# Patient Record
Sex: Female | Born: 1960 | Race: Black or African American | Hispanic: No | Marital: Single | Smoking: Never smoker
Health system: Southern US, Community
[De-identification: ages and names within clinical notes are randomized; demographics above are authoritative.]

---

## 2004-01-14 ENCOUNTER — Emergency Department (HOSPITAL_COMMUNITY): Admission: EM | Admit: 2004-01-14 | Discharge: 2004-01-14 | Payer: Self-pay | Admitting: Family Medicine

## 2020-03-19 ENCOUNTER — Ambulatory Visit
Admission: EM | Admit: 2020-03-19 | Discharge: 2020-03-19 | Disposition: A | Discharge status: Home / Self Care | Payer: 59 | Attending: Emergency Medicine | Admitting: Emergency Medicine

## 2020-03-19 DIAGNOSIS — R112 Nausea with vomiting, unspecified: Secondary | ICD-10-CM

## 2020-03-19 DIAGNOSIS — R509 Fever, unspecified: Secondary | ICD-10-CM | POA: Diagnosis not present

## 2020-03-19 LAB — POC SARS CORONAVIRUS 2 AG -  ED: SARS Coronavirus 2 Ag: NEGATIVE

## 2020-03-19 MED ORDER — ACETAMINOPHEN 325 MG PO TABS
975.0000 mg | ORAL_TABLET | Freq: Once | ORAL | Status: AC
  Administered 2020-03-19: 975 mg via ORAL

## 2020-03-19 MED ORDER — ONDANSETRON 4 MG PO TBDP: 4.0000 mg | ORAL_TABLET | Freq: Three times a day (TID) | ORAL | 0 refills | Status: AC | PRN

## 2020-03-19 MED ORDER — ONDANSETRON 4 MG PO TBDP
4.0000 mg | ORAL_TABLET | Freq: Once | ORAL | Status: AC
  Administered 2020-03-19: 4 mg via ORAL

## 2020-03-19 NOTE — ED Triage Notes (Signed)
Pt presents to UC for n/v since Sunday. Pt states she traveled home from [place] on Sunday and has been unable to take PO since. Pt denies chills, or body aches. Pt denies loss of taste or smell. PT denies runny nose, HA, or congestion. Pt denies OTC treatment, or relieving factors. Pt states she is feeling dizzy. Pt ambulated to treatment area unassisted.

## 2020-03-19 NOTE — ED Provider Notes (Signed)
[facility]    CSN: [id number] Arrival date & time: 03/19/20  1326      History   Chief Complaint Chief Complaint  Patient presents with  . Nausea    HPI Janice Avila is a 59 y.o. female presenting for nausea and vomiting since Sunday.  Patient states she traveled home from [place] on Sunday and has had poor oral intake since.  States that if she drinks water she throws it up.  Denies chest pain, palpitations, difficulty breathing, lightheadedness or change in vision/hearing.  Patient does feel a little dizzy at times, particularly when standing up.  No fall, lower leg swelling or history of DVT.  Denies history of pancreatitis, abdominal pain, change in urination or bowel habit.  No bloody or biliary emesis.  No projectile vomiting.   History reviewed. No pertinent past medical history.  There are no problems to display for this patient.   History reviewed. No pertinent surgical history.  OB History   No obstetric history on file.      Home Medications    Prior to Admission medications   Medication Sig Start Date End Date Taking? Authorizing Provider  ondansetron (ZOFRAN ODT) 4 MG disintegrating tablet Take 1 tablet (4 mg total) by mouth every 8 (eight) hours as needed for nausea or vomiting. 03/19/20   Hall-Potvin, [place], PA-C    Family History History reviewed. No pertinent family history.  Social History Social History   Tobacco Use  . Smoking status: Never Smoker  . Smokeless tobacco: Never Used  Substance Use Topics  . Alcohol use: Not on file  . Drug use: Not on file     Allergies   Patient has no allergy information on record.   Review of Systems As per HPI   Physical Exam Triage Vital Signs ED Triage Vitals  Enc Vitals Group     BP      Pulse      Resp      Temp      Temp src      SpO2      Weight      Height      Head Circumference      Peak Flow      Pain Score      Pain Loc      Pain Edu?      Excl. in [place]?     No data found.  Updated Vital Signs BP (!) 154/97 (BP Location: Left Arm)   Pulse 87   Temp (!) 102.7 F (39.3 C) (Oral)   Resp 18   SpO2 97%   Visual Acuity Right Eye Distance:   Left Eye Distance:   Bilateral Distance:    Right Eye Near:   Left Eye Near:    Bilateral Near:     Physical Exam Constitutional:      General: She is not in acute distress. HENT:     Head: Normocephalic and atraumatic.     Mouth/Throat:     Mouth: Mucous membranes are moist.     Pharynx: Oropharynx is clear.  Eyes:     General: No scleral icterus.    Conjunctiva/sclera: Conjunctivae normal.     Pupils: Pupils are equal, round, and reactive to light.  Cardiovascular:     Rate and Rhythm: Normal rate and regular rhythm.  Pulmonary:     Effort: Pulmonary effort is normal. No respiratory distress.     Breath sounds: No wheezing.  Abdominal:  General: Bowel sounds are normal.     Palpations: Abdomen is soft.     Tenderness: There is no abdominal tenderness.  Musculoskeletal:     Cervical back: Neck supple. No tenderness.     Right lower leg: No edema.     Left lower leg: No edema.  Lymphadenopathy:     Cervical: No cervical adenopathy.  Skin:    Capillary Refill: Capillary refill takes less than 2 seconds.     Coloration: Skin is not jaundiced or pale.  Neurological:     General: No focal deficit present.     Mental Status: She is alert and oriented to person, place, and time.      UC Treatments / Results  Labs (all labs ordered are listed, but only abnormal results are displayed) Labs Reviewed  POC SARS CORONAVIRUS 2 AG -  ED - Normal  NOVEL CORONAVIRUS, NAA    EKG   Radiology No results found.  Procedures Procedures (including critical care time)  Medications Ordered in UC Medications  acetaminophen (TYLENOL) tablet 975 mg (975 mg Oral Given 03/19/20 1354)  ondansetron (ZOFRAN-ODT) disintegrating tablet 4 mg (4 mg Oral Given 03/19/20 1354)    Initial  Impression / Assessment and Plan / UC Course  I have reviewed the triage vital signs and the nursing notes.  Pertinent labs & imaging results that were available during my care of the patient were reviewed by me and considered in my medical decision making (see chart for details).     Patient afebrile, nontoxic, with SpO2 97%.  Rapid Covid negative, PCR pending.  Patient to quarantine until results are back.  Patient given Tylenol, Zofran for symptoms: Tolerated well and endorsing improvement of symptoms at time of discharge.  Successfully completed 100 cc p.o. challenge without emesis.  We will treat supportively as outlined below.  Focus on oral fluid resuscitation.  Return precautions discussed, patient verbalized understanding and is agreeable to plan. Final Clinical Impressions(s) / UC Diagnoses   Final diagnoses:  Fever, unspecified  Non-intractable vomiting with nausea, unspecified vomiting type     Discharge Instructions     Take Zofran every 8 hours as needed for nausea. Important to push fluids: Water, sugar-free Gatorade, sugar-free popsicles. Go to ER for worsening vomiting, fever, or you develop chest pain, difficulty breathing, abdominal pain.    ED Prescriptions    Medication Sig Dispense Auth. Provider   ondansetron (ZOFRAN ODT) 4 MG disintegrating tablet Take 1 tablet (4 mg total) by mouth every 8 (eight) hours as needed for nausea or vomiting. 21 tablet Hall-Potvin, [place], PA-C     PDMP not reviewed this encounter.   Hall-Potvin, [place], [place] 03/19/20 1525

## 2020-03-19 NOTE — Discharge Instructions (Signed)
Take Zofran every 8 hours as needed for nausea. Important to push fluids: Water, sugar-free Gatorade, sugar-free popsicles. Go to ER for worsening vomiting, fever, or you develop chest pain, difficulty breathing, abdominal pain.

## 2020-03-20 ENCOUNTER — Emergency Department (HOSPITAL_COMMUNITY): Payer: 59

## 2020-03-20 ENCOUNTER — Encounter (HOSPITAL_COMMUNITY): Payer: Self-pay | Admitting: *Deleted

## 2020-03-20 ENCOUNTER — Emergency Department (HOSPITAL_COMMUNITY)
Admission: EM | Admit: 2020-03-20 | Discharge: 2020-03-20 | Disposition: A | Discharge status: Home / Self Care | Payer: 59 | Attending: Emergency Medicine | Admitting: Emergency Medicine

## 2020-03-20 ENCOUNTER — Other Ambulatory Visit: Payer: Self-pay

## 2020-03-20 DIAGNOSIS — J189 Pneumonia, unspecified organism: Secondary | ICD-10-CM | POA: Insufficient documentation

## 2020-03-20 DIAGNOSIS — M549 Dorsalgia, unspecified: Secondary | ICD-10-CM | POA: Diagnosis not present

## 2020-03-20 DIAGNOSIS — R509 Fever, unspecified: Secondary | ICD-10-CM | POA: Diagnosis present

## 2020-03-20 LAB — CBC WITH DIFFERENTIAL/PLATELET
Abs Immature Granulocytes: 0.04 10*3/uL (ref 0.00–0.07)
Basophils Absolute: 0 10*3/uL (ref 0.0–0.1)
Basophils Relative: 0 %
Eosinophils Absolute: 0 10*3/uL (ref 0.0–0.5)
Eosinophils Relative: 0 %
HCT: 41.3 % (ref 36.0–46.0)
Hemoglobin: 13.2 g/dL (ref 12.0–15.0)
Immature Granulocytes: 1 %
Lymphocytes Relative: 9 %
Lymphs Abs: 0.8 10*3/uL (ref 0.7–4.0)
MCH: 28.9 pg (ref 26.0–34.0)
MCHC: 32 g/dL (ref 30.0–36.0)
MCV: 90.4 fL (ref 80.0–100.0)
Monocytes Absolute: 0.4 10*3/uL (ref 0.1–1.0)
Monocytes Relative: 5 %
Neutro Abs: 7.4 10*3/uL (ref 1.7–7.7)
Neutrophils Relative %: 85 %
Platelets: 319 10*3/uL (ref 150–400)
RBC: 4.57 MIL/uL (ref 3.87–5.11)
RDW: 13.3 % (ref 11.5–15.5)
WBC: 8.6 10*3/uL (ref 4.0–10.5)
nRBC: 0 % (ref 0.0–0.2)

## 2020-03-20 LAB — COMPREHENSIVE METABOLIC PANEL
ALT: 56 U/L — ABNORMAL HIGH (ref 0–44)
AST: 57 U/L — ABNORMAL HIGH (ref 15–41)
Albumin: 3.2 g/dL — ABNORMAL LOW (ref 3.5–5.0)
Alkaline Phosphatase: 71 U/L (ref 38–126)
Anion gap: 13 (ref 5–15)
BUN: 19 mg/dL (ref 6–20)
CO2: 26 mmol/L (ref 22–32)
Calcium: 8.5 mg/dL — ABNORMAL LOW (ref 8.9–10.3)
Chloride: 97 mmol/L — ABNORMAL LOW (ref 98–111)
Creatinine, Ser: 1.69 mg/dL — ABNORMAL HIGH (ref 0.44–1.00)
GFR calc Af Amer: 38 mL/min — ABNORMAL LOW (ref >60)
GFR calc non Af Amer: 33 mL/min — ABNORMAL LOW (ref >60)
Glucose, Bld: 151 mg/dL — ABNORMAL HIGH (ref 70–99)
Potassium: 3.2 mmol/L — ABNORMAL LOW (ref 3.5–5.1)
Sodium: 136 mmol/L (ref 135–145)
Total Bilirubin: 0.8 mg/dL (ref 0.3–1.2)
Total Protein: 7.4 g/dL (ref 6.5–8.1)

## 2020-03-20 LAB — URINALYSIS, ROUTINE W REFLEX MICROSCOPIC
Bilirubin Urine: NEGATIVE
Glucose, UA: NEGATIVE mg/dL
Ketones, ur: NEGATIVE mg/dL
Leukocytes,Ua: NEGATIVE
Nitrite: NEGATIVE
Protein, ur: >=300 mg/dL — AB
RBC / HPF: >50 RBC/hpf — ABNORMAL HIGH (ref 0–5)
Specific Gravity, Urine: 1.028 (ref 1.005–1.030)
pH: 5 (ref 5.0–8.0)

## 2020-03-20 LAB — NOVEL CORONAVIRUS, NAA: SARS-CoV-2, NAA: NOT DETECTED

## 2020-03-20 LAB — SARS-COV-2, NAA 2 DAY TAT

## 2020-03-20 MED ORDER — AZITHROMYCIN 250 MG PO TABS
250.0000 mg | ORAL_TABLET | Freq: Every day | ORAL | 0 refills | Status: AC
Start: 2020-03-21 — End: ?

## 2020-03-20 MED ORDER — SODIUM CHLORIDE 0.9 % IV BOLUS
1000.0000 mL | Freq: Once | INTRAVENOUS | Status: AC
  Administered 2020-03-20: 1000 mL via INTRAVENOUS

## 2020-03-20 MED ORDER — ACETAMINOPHEN 325 MG PO TABS
650.0000 mg | ORAL_TABLET | Freq: Once | ORAL | Status: AC
  Administered 2020-03-20: 650 mg via ORAL
  Filled 2020-03-20: qty 2

## 2020-03-20 MED ORDER — AZITHROMYCIN 250 MG PO TABS
500.0000 mg | ORAL_TABLET | Freq: Once | ORAL | Status: AC
  Administered 2020-03-20: 500 mg via ORAL
  Filled 2020-03-20: qty 2

## 2020-03-20 MED ORDER — CEFDINIR 300 MG PO CAPS
600.0000 mg | ORAL_CAPSULE | Freq: Every day | ORAL | Status: DC
  Administered 2020-03-20: 600 mg via ORAL
  Filled 2020-03-20: qty 2

## 2020-03-20 MED ORDER — CEFUROXIME AXETIL 500 MG PO TABS: 500.0000 mg | ORAL_TABLET | Freq: Two times a day (BID) | ORAL | 0 refills | Status: AC

## 2020-03-20 MED ORDER — SODIUM CHLORIDE 0.9% FLUSH: 3.0000 mL | Freq: Once | INTRAVENOUS | Status: DC

## 2020-03-20 NOTE — ED Triage Notes (Addendum)
Pt reports feeling bad since flying back from [place] on Sunday. Went to ucc yesterday and given zofran for her nausea but is still having a fever. Denies any other symptoms except occasional right side back pain.

## 2020-03-20 NOTE — Discharge Instructions (Signed)
You can take Tylenol every 6 hours as needed for fever.  Take the antibiotics starting tomorrow, as directed, until gone. It is important you establish primary care.  In the meantime, please follow-up at urgent care for recheck in 2 to 3 days to ensure your feeling better. If symptoms worsen, if you develop shortness of breath, or any new or concerning symptoms, please return to the emergency department.

## 2020-03-20 NOTE — ED Provider Notes (Signed)
[facility] EMERGENCY DEPARTMENT Provider Note   CSN: [id number] Arrival date & time: 03/20/20  1158     History Chief Complaint  Patient presents with  . Fever  . Nausea  . Back Pain    Janice Avila is a 59 y.o. female presenting to the ED with 5 days of fevers.  She states symptoms began on Sunday while she was traveling home from [place].  She states symptoms initially began with dry heaves and vomiting.  Her vomiting has improved, though she has had decreased appetite.  Today is a first day she feels as though she is hungry.  She has had a cough productive of a small amount of phlegm.  Her back is aching her, worse on the right side though no urinary symptoms.  She states she did not have any of the recommended vaccines such as typhoid or yellow fever prior to traveling to [place].  She was seen at urgent care yesterday who tested her for Covid which were both negative.  She denies abdominal pain, diarrhea, shortness of breath, sore throat, urinary symptoms.  She only drank bottled water while in [place].  She denies any mosquito bites. She reports she has not had any regular medical care in at least 20 years, she states the last laboratory work was probably about 10 years ago.  She has no known history of hypertension though is unsure.  She is unsure of her baseline kidney function.  The history is provided by the patient and medical records.       History reviewed. No pertinent past medical history.  There are no problems to display for this patient.   History reviewed. No pertinent surgical history.   OB History   No obstetric history on file.     History reviewed. No pertinent family history.  Social History   Tobacco Use  . Smoking status: Never Smoker  . Smokeless tobacco: Never Used  Substance Use Topics  . Alcohol use: Not on file  . Drug use: Not on file    Home Medications Prior to Admission medications   Medication Sig Start Date End  Date Taking? Authorizing Provider  ascorbic Acid (VITAMIN C) 500 MG CPCR Take 500 mg by mouth daily.   Yes [provider]  Cholecalciferol (VITAMIN D3) 125 MCG (5000 UT) CAPS Take 5,000 Units by mouth daily.   Yes [provider]  Cyanocobalamin (VITAMIN B-12) 5000 MCG TBDP Take 5,000 mcg by mouth daily.   Yes [provider]  Multiple Vitamin (MULTIVITAMIN WITH MINERALS) TABS tablet Take 1 tablet by mouth daily.   Yes [provider]  Multiple Vitamins-Minerals (ZINC PO) Take 1 capsule by mouth daily.   Yes [provider]  ondansetron (ZOFRAN ODT) 4 MG disintegrating tablet Take 1 tablet (4 mg total) by mouth every 8 (eight) hours as needed for nausea or vomiting. 03/19/20  Yes Hall-Potvin, [place], PA-C  TURMERIC PO Take 1 capsule by mouth daily.   Yes [provider]  azithromycin (ZITHROMAX) 250 MG tablet Take 1 tablet (250 mg total) by mouth daily. 03/21/20   Mohini Heathcock, [place] N, PA-C  cefUROXime (CEFTIN) 500 MG tablet Take 1 tablet (500 mg total) by mouth 2 (two) times daily with a meal for 6 days. 03/21/20 03/27/20  Kessa Fairbairn, [place] N, PA-C    Allergies    Patient has no known allergies.  Review of Systems   Review of Systems  Constitutional: Positive for appetite change, chills and fever.  Respiratory: Positive for cough. Negative for shortness of breath.   Gastrointestinal: Positive for nausea and vomiting. Negative for abdominal pain and diarrhea.  Genitourinary: Negative for dysuria and frequency.  Musculoskeletal: Positive for back pain.  Skin: Negative for rash.  All other systems reviewed and are negative.   Physical Exam Updated Vital Signs BP 137/82   Pulse 63   Temp (!) 100.9 F (38.3 C) (Oral)   Resp (!) 22   SpO2 97%   Physical Exam Vitals and nursing note reviewed.  Constitutional:      General: She is not in acute distress.    Appearance: She is well-developed.  HENT:     Head: Normocephalic and  atraumatic.     Mouth/Throat:     Mouth: Mucous membranes are moist.     Pharynx: Oropharynx is clear.  Eyes:     Conjunctiva/sclera: Conjunctivae normal.  Cardiovascular:     Rate and Rhythm: Normal rate and regular rhythm.  Pulmonary:     Effort: Pulmonary effort is normal. No respiratory distress.     Comments: Faint rales in the bases Abdominal:     General: Bowel sounds are normal.     Palpations: Abdomen is soft.     Tenderness: There is no abdominal tenderness. There is no guarding or rebound.  Musculoskeletal:     Cervical back: Normal range of motion and neck supple. No tenderness.  Lymphadenopathy:     Cervical: No cervical adenopathy.  Skin:    General: Skin is warm.     Findings: No rash.  Neurological:     Mental Status: She is alert.  Psychiatric:        Behavior: Behavior normal.     ED Results / Procedures / Treatments   Labs (all labs ordered are listed, but only abnormal results are displayed) Labs Reviewed  URINALYSIS, ROUTINE W REFLEX MICROSCOPIC - Abnormal; Notable for the following components:      Result Value   Color, Urine AMBER (*)    APPearance CLOUDY (*)    Hgb urine dipstick LARGE (*)    Protein, ur >=300 (*)    RBC / HPF >50 (*)    Bacteria, UA RARE (*)    All other components within normal limits  COMPREHENSIVE METABOLIC PANEL - Abnormal; Notable for the following components:   Potassium 3.2 (*)    Chloride 97 (*)    Glucose, Bld 151 (*)    Creatinine, Ser 1.69 (*)    Calcium 8.5 (*)    Albumin 3.2 (*)    AST 57 (*)    ALT 56 (*)    GFR calc non Af Amer 33 (*)    GFR calc Af Amer 38 (*)    All other components within normal limits  URINE CULTURE  CBC WITH DIFFERENTIAL/PLATELET    EKG None  Radiology DG Chest Port 1 View  Result Date: 03/20/2020 CLINICAL DATA:  Cough and fever. EXAM: PORTABLE CHEST 1 VIEW COMPARISON:  None. FINDINGS: The heart size and pulmonary vascularity are normal. There is bibasilar atelectasis, left  greater than right with small air bronchograms at the left lung base consistent with focal lung consolidation. No discrete effusions.  No bone abnormality. IMPRESSION: Bibasilar atelectasis, left greater than right with focal lung consolidation at the left lung base. Electronically Signed   By: Lorriane Shire M.D.   On: 03/20/2020 17:30    Procedures Procedures (including critical care time)  Medications Ordered in ED Medications  sodium chloride flush (  NS) 0.9 % injection 3 mL (has no administration in time range)  cefdinir (OMNICEF) capsule 600 mg (600 mg Oral Given 03/20/20 1907)  sodium chloride 0.9 % bolus 1,000 mL (0 mLs Intravenous Stopped 03/20/20 2007)  azithromycin (ZITHROMAX) tablet 500 mg (500 mg Oral Given 03/20/20 1840)  acetaminophen (TYLENOL) tablet 650 mg (650 mg Oral Given 03/20/20 1926)    ED Course  I have reviewed the triage vital signs and the nursing notes.  Pertinent labs & imaging results that were available during my care of the patient were reviewed by me and considered in my medical decision making (see chart for details).  Clinical Course as of Mar 20 2045  Thu Mar 20, 2020  1816 Consulted with Dr. Drue Second with ID for abx coverage given pt most likely contracted illness in [place]. Azithro and ceftin   [JR]    Clinical Course User Index [JR] Hendrick Pavich, [place] N, PA-C   MDM Rules/Calculators/A&P                          Patient presenting with fever, cough, nausea/vomiting since "Sunday.  Symptoms began on her return trip home from [place].  No known mosquito bites.  Negative Covid tests yesterday at urgent care including the antigen test and the PCR.  Vomiting has stopped, no diarrhea or abdominal pain.  No urinary symptoms.  On exam, she is in no distress.  She had low-grade fever on arrival to 100.5 F, hypertensive, normal heart rate respiratory rate.  CBC has normal white count.  Metabolic panel with creatinine of 1.7 though unclear of patient's baseline given  lack of medical care for at least 10+ years.  Mild hypokalemia, replaced orally.  Slightly elevated LFTs.  Abdomen is benign.  UA appears to be contaminated with 11-20 squamous, rare bacteria, negative leuks negative nitrates.  Culture sent.  Chest x-ray with findings consistent with pneumonia given patient's clinical presentation this is likely diagnosis.  Given patient's recent travel, consulted with infectious disease, Dr. Snider for antibiotic coverage.  She recommends azithro and ceftin.   Mistakenly thought patient received tylenol in triage, pt given tylenol for fever. Once fluids finish, pt will be appropriate for discharge.  Temp downtrending at discharge.  Instructed close outpatient follow-up and strict return precautions.  Patient safe for discharge. Final Clinical Impression(s) / ED Diagnoses Final diagnoses:  Community acquired pneumonia, unspecified laterality    Rx / DC Orders ED Discharge Orders         Ordered    azithromycin (ZITHROMAX) 250 MG tablet  Daily     Discontinue  Reprint     03/20/20 1920    cefUROXime (CEFTIN) 500 MG tablet  2 times daily with meals     Discontinue  Reprint     06" /24/21 1920           Juwon Scripter, 05-21-1997 N, PA-C 03/20/20 2046    2047, MD 03/25/20 1036

## 2020-03-21 LAB — URINE CULTURE: Culture: NO GROWTH

## 2021-08-29 IMAGING — DX DG CHEST 1V PORT
1 series · 1 of 1 positions shown · non-contrast
Comparison: None.

CLINICAL DATA: Cough and fever.

EXAM:
PORTABLE CHEST 1 VIEW

[chest]
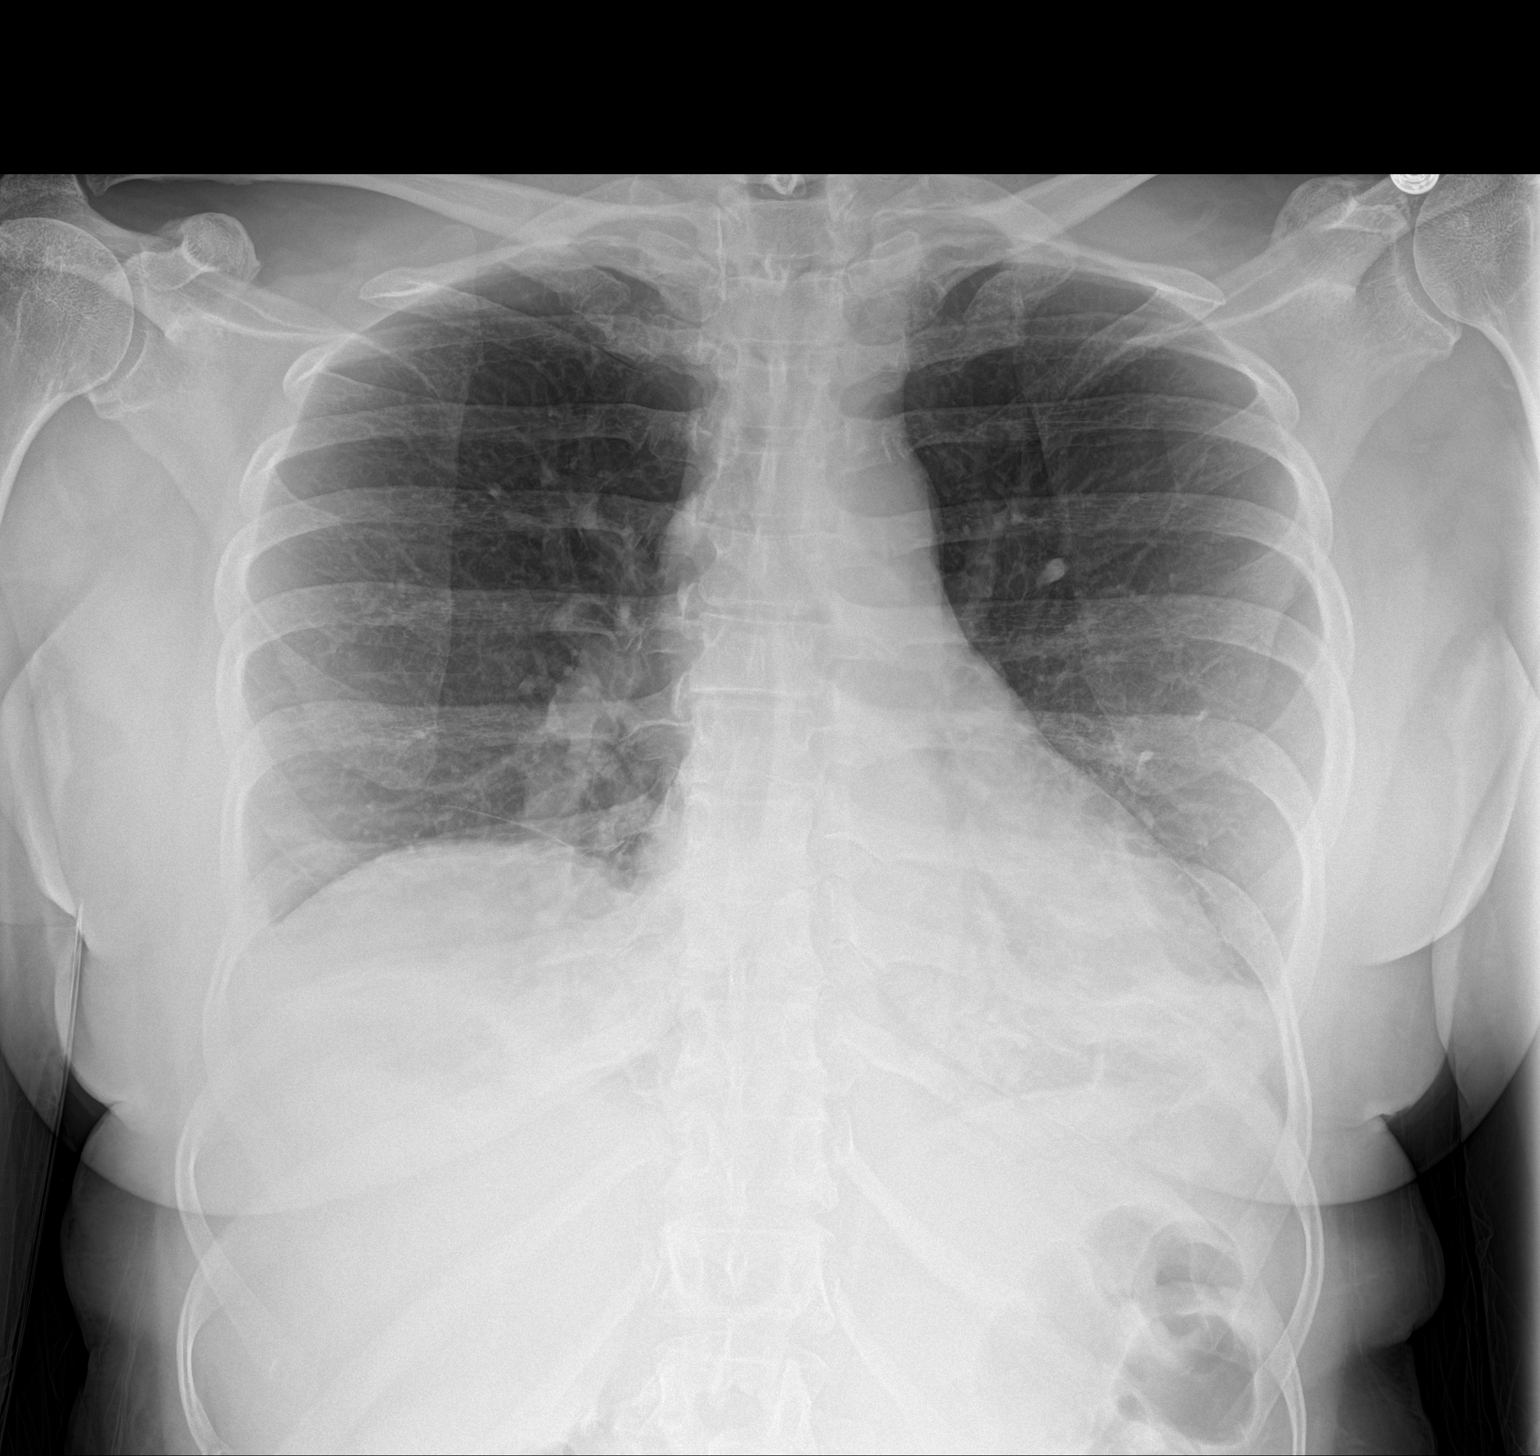

[1 of 1 positions shown; findings below may reference images not displayed]

FINDINGS: The heart size and pulmonary vascularity are normal. There is
bibasilar atelectasis, left greater than right with small air
bronchograms at the left lung base consistent with focal lung
consolidation.

No discrete effusions.  No bone abnormality.
IMPRESSION: Bibasilar atelectasis, left greater than right with focal lung
consolidation at the left lung base.
